# Patient Record
Sex: Female | Born: 1975 | Race: White | Hispanic: No | Marital: Single | Smoking: Former smoker
Health system: Southern US, Community
[De-identification: ages and names within clinical notes are randomized; demographics above are authoritative.]

## PROBLEM LIST (undated history)

## (undated) DIAGNOSIS — F1920 Other psychoactive substance dependence, uncomplicated: Secondary | ICD-10-CM

## (undated) HISTORY — PX: GASTRIC BYPASS: SHX52

---

## 2015-12-22 DIAGNOSIS — F419 Anxiety disorder, unspecified: Secondary | ICD-10-CM | POA: Insufficient documentation

## 2015-12-22 DIAGNOSIS — F1210 Cannabis abuse, uncomplicated: Secondary | ICD-10-CM | POA: Insufficient documentation

## 2016-01-04 DIAGNOSIS — F1020 Alcohol dependence, uncomplicated: Secondary | ICD-10-CM | POA: Insufficient documentation

## 2016-01-13 DIAGNOSIS — F1994 Other psychoactive substance use, unspecified with psychoactive substance-induced mood disorder: Secondary | ICD-10-CM | POA: Insufficient documentation

## 2017-07-23 DIAGNOSIS — D2239 Melanocytic nevi of other parts of face: Secondary | ICD-10-CM | POA: Diagnosis not present

## 2017-08-14 DIAGNOSIS — Z23 Encounter for immunization: Secondary | ICD-10-CM | POA: Diagnosis not present

## 2017-12-20 DIAGNOSIS — H5213 Myopia, bilateral: Secondary | ICD-10-CM | POA: Diagnosis not present

## 2017-12-20 DIAGNOSIS — H52223 Regular astigmatism, bilateral: Secondary | ICD-10-CM | POA: Diagnosis not present

## 2017-12-28 ENCOUNTER — Encounter (HOSPITAL_COMMUNITY): Payer: Self-pay

## 2017-12-28 ENCOUNTER — Other Ambulatory Visit: Payer: Self-pay

## 2017-12-28 ENCOUNTER — Emergency Department (HOSPITAL_COMMUNITY)
Admission: EM | Admit: 2017-12-28 | Discharge: 2017-12-28 | Disposition: A | Discharge status: Home / Self Care | Payer: No Typology Code available for payment source | Attending: Emergency Medicine | Admitting: Emergency Medicine

## 2017-12-28 DIAGNOSIS — M542 Cervicalgia: Secondary | ICD-10-CM | POA: Diagnosis not present

## 2017-12-28 DIAGNOSIS — R51 Headache: Secondary | ICD-10-CM | POA: Insufficient documentation

## 2017-12-28 DIAGNOSIS — M549 Dorsalgia, unspecified: Secondary | ICD-10-CM | POA: Insufficient documentation

## 2017-12-28 MED ORDER — IBUPROFEN 800 MG PO TABS
800.0000 mg | ORAL_TABLET | Freq: Once | ORAL | Status: AC
Start: 2017-12-28 — End: 2017-12-28
  Administered 2017-12-28: 800 mg via ORAL
  Filled 2017-12-28: qty 1

## 2017-12-28 MED ORDER — CYCLOBENZAPRINE HCL 10 MG PO TABS: 10.0000 mg | ORAL_TABLET | Freq: Two times a day (BID) | ORAL | 0 refills | Status: AC | PRN

## 2017-12-28 MED ORDER — IBUPROFEN 800 MG PO TABS: 800.0000 mg | ORAL_TABLET | Freq: Three times a day (TID) | ORAL | 0 refills | Status: AC
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
## 2018-12-05 DIAGNOSIS — B977 Papillomavirus as the cause of diseases classified elsewhere: Secondary | ICD-10-CM | POA: Diagnosis not present

## 2018-12-05 DIAGNOSIS — D649 Anemia, unspecified: Secondary | ICD-10-CM | POA: Diagnosis not present

## 2018-12-05 DIAGNOSIS — N926 Irregular menstruation, unspecified: Secondary | ICD-10-CM | POA: Diagnosis not present

## 2018-12-05 DIAGNOSIS — Z01419 Encounter for gynecological examination (general) (routine) without abnormal findings: Secondary | ICD-10-CM | POA: Diagnosis not present

## 2018-12-05 DIAGNOSIS — Z23 Encounter for immunization: Secondary | ICD-10-CM | POA: Diagnosis not present

## 2018-12-05 DIAGNOSIS — Z114 Encounter for screening for human immunodeficiency virus [HIV]: Secondary | ICD-10-CM | POA: Diagnosis not present

## 2018-12-05 DIAGNOSIS — Z0000 Encounter for general adult medical examination without abnormal findings: Secondary | ICD-10-CM | POA: Diagnosis not present

## 2018-12-05 DIAGNOSIS — Z118 Encounter for screening for other infectious and parasitic diseases: Secondary | ICD-10-CM | POA: Diagnosis not present

## 2018-12-05 DIAGNOSIS — Z1329 Encounter for screening for other suspected endocrine disorder: Secondary | ICD-10-CM | POA: Diagnosis not present

## 2018-12-05 DIAGNOSIS — E538 Deficiency of other specified B group vitamins: Secondary | ICD-10-CM | POA: Diagnosis not present

## 2018-12-05 DIAGNOSIS — Z1151 Encounter for screening for human papillomavirus (HPV): Secondary | ICD-10-CM | POA: Diagnosis not present

## 2018-12-05 DIAGNOSIS — N760 Acute vaginitis: Secondary | ICD-10-CM | POA: Diagnosis not present

## 2018-12-05 DIAGNOSIS — Z139 Encounter for screening, unspecified: Secondary | ICD-10-CM | POA: Diagnosis not present

## 2018-12-05 DIAGNOSIS — Z113 Encounter for screening for infections with a predominantly sexual mode of transmission: Secondary | ICD-10-CM | POA: Diagnosis not present

## 2018-12-08 ENCOUNTER — Other Ambulatory Visit: Payer: Self-pay | Admitting: Family Medicine

## 2018-12-08 DIAGNOSIS — Z1231 Encounter for screening mammogram for malignant neoplasm of breast: Secondary | ICD-10-CM

## 2018-12-26 DIAGNOSIS — Z23 Encounter for immunization: Secondary | ICD-10-CM | POA: Diagnosis not present

## 2018-12-26 DIAGNOSIS — Z0000 Encounter for general adult medical examination without abnormal findings: Secondary | ICD-10-CM | POA: Diagnosis not present

## 2018-12-26 DIAGNOSIS — Z6832 Body mass index (BMI) 32.0-32.9, adult: Secondary | ICD-10-CM | POA: Diagnosis not present

## 2019-01-02 ENCOUNTER — Ambulatory Visit
Admission: RE | Admit: 2019-01-02 | Discharge: 2019-01-02 | Disposition: A | Discharge status: Home / Self Care | Payer: BLUE CROSS/BLUE SHIELD | Source: Ambulatory Visit | Attending: Family Medicine | Admitting: Family Medicine

## 2019-01-02 ENCOUNTER — Ambulatory Visit: Payer: Self-pay

## 2019-01-02 DIAGNOSIS — Z1231 Encounter for screening mammogram for malignant neoplasm of breast: Secondary | ICD-10-CM | POA: Diagnosis not present

## 2019-01-23 DIAGNOSIS — H5213 Myopia, bilateral: Secondary | ICD-10-CM | POA: Diagnosis not present

## 2019-01-23 DIAGNOSIS — H52223 Regular astigmatism, bilateral: Secondary | ICD-10-CM | POA: Diagnosis not present

## 2019-03-30 DIAGNOSIS — R899 Unspecified abnormal finding in specimens from other organs, systems and tissues: Secondary | ICD-10-CM | POA: Diagnosis not present

## 2019-03-30 DIAGNOSIS — R946 Abnormal results of thyroid function studies: Secondary | ICD-10-CM | POA: Diagnosis not present

## 2019-04-03 DIAGNOSIS — L659 Nonscarring hair loss, unspecified: Secondary | ICD-10-CM | POA: Diagnosis not present

## 2019-04-03 DIAGNOSIS — E039 Hypothyroidism, unspecified: Secondary | ICD-10-CM | POA: Diagnosis not present

## 2019-04-03 DIAGNOSIS — M7542 Impingement syndrome of left shoulder: Secondary | ICD-10-CM | POA: Diagnosis not present

## 2019-04-14 DIAGNOSIS — R197 Diarrhea, unspecified: Secondary | ICD-10-CM | POA: Diagnosis not present

## 2019-04-14 DIAGNOSIS — A084 Viral intestinal infection, unspecified: Secondary | ICD-10-CM | POA: Diagnosis not present

## 2019-04-14 DIAGNOSIS — A0472 Enterocolitis due to Clostridium difficile, not specified as recurrent: Secondary | ICD-10-CM | POA: Diagnosis not present

## 2019-04-15 DIAGNOSIS — A0472 Enterocolitis due to Clostridium difficile, not specified as recurrent: Secondary | ICD-10-CM | POA: Diagnosis not present

## 2019-04-15 DIAGNOSIS — R197 Diarrhea, unspecified: Secondary | ICD-10-CM | POA: Diagnosis not present

## 2019-04-15 DIAGNOSIS — A084 Viral intestinal infection, unspecified: Secondary | ICD-10-CM | POA: Diagnosis not present

## 2019-06-03 DIAGNOSIS — E039 Hypothyroidism, unspecified: Secondary | ICD-10-CM | POA: Diagnosis not present

## 2019-06-05 DIAGNOSIS — L659 Nonscarring hair loss, unspecified: Secondary | ICD-10-CM | POA: Diagnosis not present

## 2019-06-05 DIAGNOSIS — E039 Hypothyroidism, unspecified: Secondary | ICD-10-CM | POA: Diagnosis not present

## 2019-06-05 DIAGNOSIS — M7542 Impingement syndrome of left shoulder: Secondary | ICD-10-CM | POA: Diagnosis not present

## 2019-08-06 DIAGNOSIS — E039 Hypothyroidism, unspecified: Secondary | ICD-10-CM | POA: Diagnosis not present

## 2019-08-07 DIAGNOSIS — E039 Hypothyroidism, unspecified: Secondary | ICD-10-CM | POA: Diagnosis not present

## 2019-08-07 DIAGNOSIS — M7542 Impingement syndrome of left shoulder: Secondary | ICD-10-CM | POA: Diagnosis not present

## 2019-08-07 DIAGNOSIS — B373 Candidiasis of vulva and vagina: Secondary | ICD-10-CM | POA: Diagnosis not present

## 2020-01-21 DIAGNOSIS — D225 Melanocytic nevi of trunk: Secondary | ICD-10-CM | POA: Diagnosis not present

## 2020-01-21 DIAGNOSIS — L814 Other melanin hyperpigmentation: Secondary | ICD-10-CM | POA: Diagnosis not present

## 2020-02-24 ENCOUNTER — Ambulatory Visit: Payer: BLUE CROSS/BLUE SHIELD | Admitting: Physician Assistant

## 2020-02-24 DIAGNOSIS — Z0289 Encounter for other administrative examinations: Secondary | ICD-10-CM

## 2020-02-24 DIAGNOSIS — E039 Hypothyroidism, unspecified: Secondary | ICD-10-CM | POA: Insufficient documentation

## 2020-02-24 NOTE — Progress Notes (Deleted)
Sandra Kent is a 44 y.o. female here to establish care.  I acted as a Neurosurgeon for [employer], PA-C [employer], [place]  History of Present Illness:   No chief complaint on file.   Acute Concerns:   Chronic Issues: Hypothyroidism  Health Maintenance: Immunizations -- *** Colonoscopy -- *** Mammogram -- *** PAP -- *** Bone Density -- N/A PSA --N/A Diet -- *** Caffeine intake -- *** Sleep habits -- *** Exercise -- *** Weight --    Mood -- *** Alcohol -- *** Tobacco -- ***  No flowsheet data found.  No flowsheet data found.   Other providers/specialists: Patient Care Team: Jarold Motto, [place] as PCP - General (Physician Assistant)   No past medical history on file.   Social History   Socioeconomic History  . Marital status: Single    Spouse name: Not on file  . Number of children: Not on file  . Years of education: Not on file  . Highest education level: Not on file  Occupational History  . Not on file  Tobacco Use  . Smoking status: Not on file  Substance and Sexual Activity  . Alcohol use: Not on file  . Drug use: Not on file  . Sexual activity: Not on file  Other Topics Concern  . Not on file  Social History Narrative  . Not on file   Social Determinants of Health   Financial Resource Strain:   . Difficulty of Paying Living Expenses:   Food Insecurity:   . Worried About Programme researcher, broadcasting/film/video in the Last Year:   . Barista in the Last Year:   Transportation Needs:   . Freight forwarder (Medical):   Marland Kitchen Lack of Transportation (Non-Medical):   Physical Activity:   . Days of Exercise per Week:   . Minutes of Exercise per Session:   Stress:   . Feeling of Stress :   Social Connections:   . Frequency of Communication with Friends and Family:   . Frequency of Social Gatherings with Friends and Family:   . Attends Religious Services:   . Active Member of Clubs or Organizations:   . Attends Banker Meetings:    Marland Kitchen Marital Status:   Intimate Partner Violence:   . Fear of Current or Ex-Partner:   . Emotionally Abused:   Marland Kitchen Physically Abused:   . Sexually Abused:     Past Surgical History:  Procedure Laterality Date  . GASTRIC BYPASS      Family History  Problem Relation Age of Onset  . Breast cancer Neg Hx     Allergies  Allergen Reactions  . Effexor [Venlafaxine] Nausea And Vomiting     Current Medications:   Current Outpatient Medications:  .  cyclobenzaprine (FLEXERIL) 10 MG tablet, Take 1 tablet (10 mg total) by mouth 2 (two) times daily as needed for muscle spasms., Disp: 20 tablet, Rfl: 0 .  ibuprofen (ADVIL,MOTRIN) 800 MG tablet, Take 1 tablet (800 mg total) by mouth 3 (three) times daily., Disp: 21 tablet, Rfl: 0   Review of Systems:   ROS  Vitals:   There were no vitals filed for this visit.    There is no height or weight on file to calculate BMI.  Physical Exam:   Physical Exam  No results found for this or any previous visit.  Assessment and Plan:   There are no diagnoses linked to this encounter.  . Reviewed expectations re: course of current  medical issues. . Discussed self-management of symptoms. . Outlined signs and symptoms indicating need for more acute intervention. . Patient verbalized understanding and all questions were answered. . See orders for this visit as documented in the electronic medical record. . Patient received an After-Visit Summary.  ***  Inda Coke, PA-C

## 2020-07-11 DIAGNOSIS — L821 Other seborrheic keratosis: Secondary | ICD-10-CM | POA: Diagnosis not present

## 2020-07-14 DIAGNOSIS — E039 Hypothyroidism, unspecified: Secondary | ICD-10-CM | POA: Diagnosis not present

## 2022-01-01 ENCOUNTER — Other Ambulatory Visit: Payer: Self-pay | Admitting: Adult Medicine

## 2022-01-01 DIAGNOSIS — Z1231 Encounter for screening mammogram for malignant neoplasm of breast: Secondary | ICD-10-CM

## 2022-04-15 ENCOUNTER — Emergency Department (HOSPITAL_BASED_OUTPATIENT_CLINIC_OR_DEPARTMENT_OTHER): Payer: 59

## 2022-04-15 ENCOUNTER — Encounter (HOSPITAL_BASED_OUTPATIENT_CLINIC_OR_DEPARTMENT_OTHER): Payer: Self-pay

## 2022-04-15 ENCOUNTER — Emergency Department (HOSPITAL_BASED_OUTPATIENT_CLINIC_OR_DEPARTMENT_OTHER)
Admission: EM | Admit: 2022-04-15 | Discharge: 2022-04-15 | Disposition: A | Discharge status: Home / Self Care | Payer: 59 | Attending: Emergency Medicine | Admitting: Emergency Medicine

## 2022-04-15 ENCOUNTER — Other Ambulatory Visit: Payer: Self-pay

## 2022-04-15 DIAGNOSIS — S39012A Strain of muscle, fascia and tendon of lower back, initial encounter: Secondary | ICD-10-CM | POA: Diagnosis not present

## 2022-04-15 DIAGNOSIS — X58XXXA Exposure to other specified factors, initial encounter: Secondary | ICD-10-CM | POA: Insufficient documentation

## 2022-04-15 DIAGNOSIS — S3992XA Unspecified injury of lower back, initial encounter: Secondary | ICD-10-CM | POA: Diagnosis present

## 2022-04-15 HISTORY — DX: Other psychoactive substance dependence, uncomplicated: F19.20

## 2022-04-15 MED ORDER — LIDOCAINE 5 % EX PTCH: 1.0000 | MEDICATED_PATCH | CUTANEOUS | 0 refills | Status: DC

## 2022-04-15 MED ORDER — METHOCARBAMOL 500 MG PO TABS: 500.0000 mg | ORAL_TABLET | Freq: Two times a day (BID) | ORAL | 0 refills | Status: DC

## 2022-04-15 MED ORDER — LIDOCAINE 5 % EX PTCH
1.0000 | MEDICATED_PATCH | CUTANEOUS | Status: DC
  Administered 2022-04-15: 1 via TRANSDERMAL
  Filled 2022-04-15: qty 1

## 2022-04-15 MED ORDER — KETOROLAC TROMETHAMINE 30 MG/ML IJ SOLN
30.0000 mg | Freq: Once | INTRAMUSCULAR | Status: AC
  Administered 2022-04-15: 30 mg via INTRAMUSCULAR
  Filled 2022-04-15: qty 1

## 2022-04-15 NOTE — ED Triage Notes (Signed)
Pt presents POV with acute onset low back pain radiating into BLE. Pt reports while getting ready for church this morning she bent over to pick something up and felt her back pop and a "tearing sensation" fell to her knees and was unable to get up by herself. Pt denies any hx of the same.  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 Pt took 4 ibuprofen PTA

## 2022-04-15 NOTE — ED Provider Notes (Addendum)
[facility] EMERGENCY DEPT Provider Note   CSN: [id number] Arrival date & time: 04/15/22  1202     History  Chief Complaint  Patient presents with   Back Pain    Sandra Kent is a 46 y.o. female.  46 year old female presents with cute onset low back pain that occurred after she bent over.  Felt a pop in her back and had pain going down both legs.  Denies any prior history of back pain.  No fever or chills.  Took ibuprofen without relief.  Denies any saddle anesthesias.  No loss of bowel or bladder function      Home Medications Prior to Admission medications   Medication Sig Start Date End Date Taking? Authorizing Provider  cyanocobalamin 1000 MCG tablet Take by mouth.    [provider]  cyclobenzaprine (FLEXERIL) 10 MG tablet Take 1 tablet (10 mg total) by mouth 2 (two) times daily as needed for muscle spasms. 12/28/17   Fayrene Helper, PA-C  ibuprofen (ADVIL,MOTRIN) 800 MG tablet Take 1 tablet (800 mg total) by mouth 3 (three) times daily. 12/28/17   Fayrene Helper, PA-C  levothyroxine (SYNTHROID) 50 MCG tablet Take 50 mcg by mouth every other day. 01/28/20   [provider]  levothyroxine (SYNTHROID) 75 MCG tablet TAKE 1 TABLET (75 MCG) BY MOUTH EVERY OTHER DAY IN THE MORNING ON AN EMPTY STOMACH 01/28/20   [provider]      Allergies    Effexor [venlafaxine] and Other    Review of Systems   Review of Systems  All other systems reviewed and are negative.  Physical Exam Updated Vital Signs BP (!) 146/116 (BP Location: Left Arm)   Pulse 65   Temp 97.9 F (36.6 C)   Resp 19   SpO2 100%  Physical Exam Vitals and nursing note reviewed.  Constitutional:      General: She is not in acute distress.    Appearance: Normal appearance. She is well-developed. She is not toxic-appearing.  HENT:     Head: Normocephalic and atraumatic.  Eyes:     General: Lids are normal.     Conjunctiva/sclera: Conjunctivae normal.     Pupils:  Pupils are equal, round, and reactive to light.  Neck:     Thyroid: No thyroid mass.     Trachea: No tracheal deviation.  Cardiovascular:     Rate and Rhythm: Normal rate and regular rhythm.     Heart sounds: Normal heart sounds. No murmur heard.   No gallop.  Pulmonary:     Effort: Pulmonary effort is normal. No respiratory distress.     Breath sounds: Normal breath sounds. No stridor. No decreased breath sounds, wheezing, rhonchi or rales.  Abdominal:     General: There is no distension.     Palpations: Abdomen is soft.     Tenderness: There is no abdominal tenderness. There is no rebound.  Musculoskeletal:        General: No tenderness. Normal range of motion.     Cervical back: Normal range of motion and neck supple.       Back:  Skin:    General: Skin is warm and dry.     Findings: No abrasion or rash.  Neurological:     Mental Status: She is alert and oriented to person, place, and time. Mental status is at baseline.     GCS: GCS eye subscore is 4. GCS verbal subscore is 5. GCS motor subscore is 6.  Cranial Nerves: No cranial nerve deficit.     Sensory: No sensory deficit.     Motor: Motor function is intact.     Comments: Shortness 5 of 5 lower extremities  Psychiatric:        Attention and Perception: Attention normal.        Speech: Speech normal.        Behavior: Behavior normal.    ED Results / Procedures / Treatments   Labs (all labs ordered are listed, but only abnormal results are displayed) Labs Reviewed - No data to display  EKG None  Radiology No results found.  Procedures Procedures    Medications Ordered in ED Medications - No data to display  ED Course/ Medical Decision Making/ A&P                           Medical Decision Making Amount and/or Complexity of Data Reviewed Radiology: ordered.  Risk Prescription drug management.   Patient had a CT of lumbar spine which per radiology did not show any acute findings.  Patient  medicated for pain here and does feel better.  She is neurologically intact.  Plan will be to place patient anti-inflammatories muscle relaxants and Lidoderm patch and discharge home and follow-up with her doctor as needed        Final Clinical Impression(s) / ED Diagnoses Final diagnoses:  None    Rx / DC Orders ED Discharge Orders     None         Lorre Nick, MD 04/15/22 1800    Lorre Nick, MD 04/15/22 1800

## 2022-08-30 DIAGNOSIS — N390 Urinary tract infection, site not specified: Secondary | ICD-10-CM | POA: Diagnosis not present

## 2023-02-01 DIAGNOSIS — N3090 Cystitis, unspecified without hematuria: Secondary | ICD-10-CM | POA: Diagnosis not present

## 2023-05-13 ENCOUNTER — Other Ambulatory Visit: Payer: Self-pay

## 2023-05-13 ENCOUNTER — Emergency Department (HOSPITAL_COMMUNITY)
Admission: EM | Admit: 2023-05-13 | Discharge: 2023-05-13 | Disposition: A | Discharge status: Home / Self Care | Payer: Self-pay | Attending: Emergency Medicine | Admitting: Emergency Medicine

## 2023-05-13 DIAGNOSIS — S39012A Strain of muscle, fascia and tendon of lower back, initial encounter: Secondary | ICD-10-CM | POA: Insufficient documentation

## 2023-05-13 DIAGNOSIS — X501XXA Overexertion from prolonged static or awkward postures, initial encounter: Secondary | ICD-10-CM | POA: Insufficient documentation

## 2023-05-13 MED ORDER — METHOCARBAMOL 500 MG PO TABS
1000.0000 mg | ORAL_TABLET | Freq: Once | ORAL | Status: AC
  Administered 2023-05-13: 1000 mg via ORAL
  Filled 2023-05-13: qty 2

## 2023-05-13 MED ORDER — KETOROLAC TROMETHAMINE 10 MG PO TABS: 10.0000 mg | ORAL_TABLET | Freq: Four times a day (QID) | ORAL | 1 refills | Status: DC | PRN

## 2023-05-13 MED ORDER — LIDOCAINE 5 % EX PTCH: 1.0000 | MEDICATED_PATCH | CUTANEOUS | 1 refills | Status: AC

## 2023-05-13 MED ORDER — METHOCARBAMOL 500 MG PO TABS: 500.0000 mg | ORAL_TABLET | Freq: Two times a day (BID) | ORAL | 1 refills | Status: AC

## 2023-05-13 MED ORDER — LIDOCAINE 5 % EX PTCH
1.0000 | MEDICATED_PATCH | CUTANEOUS | Status: DC
  Administered 2023-05-13: 1 via TRANSDERMAL
  Filled 2023-05-13: qty 1

## 2023-05-13 MED ORDER — KETOROLAC TROMETHAMINE 10 MG PO TABS: 10.0000 mg | ORAL_TABLET | Freq: Four times a day (QID) | ORAL | 0 refills | Status: AC | PRN

## 2023-05-13 MED ORDER — KETOROLAC TROMETHAMINE 15 MG/ML IJ SOLN
15.0000 mg | Freq: Once | INTRAMUSCULAR | Status: AC
  Administered 2023-05-13: 15 mg via INTRAMUSCULAR
  Filled 2023-05-13: qty 1

## 2023-05-13 MED ORDER — METHOCARBAMOL 1000 MG/10ML IJ SOLN
1000.0000 mg | Freq: Once | INTRAMUSCULAR | Status: DC
  Filled 2023-05-13: qty 10

## 2023-05-13 NOTE — Discharge Instructions (Addendum)
Your back pain is most likely due to a muscular strain.  There is been a lot of research on back pain, unfortunately the only thing that seems to really help is Tylenol and NSAIDs.  Relative rest is also important to not lift greater than 10 pounds bending or twisting at the waist.  Please follow-up with your family physician.  The other thing that really seems to benefit patients is physical therapy which your doctor may send you for.  Please return to the emergency department for new numbness or weakness to your arms or legs. Difficulty with urinating or urinating or pooping on yourself.  Also if you cannot feel toilet paper when you wipe or get a fever.   Take the toradol as I have prescribed for you as needed for pain. Also take tylenol 1000mg (2 extra strength) four times a day.   Additionally, I have given you a prescription for Robaxin which is a muscle relaxer.  Take as prescribed as needed.  Do not drive or operate heavy machinery while taking this medication as it can be sedating.  I have also given you a lidocaine patch to wear as prescribed as well.  Additionally, given that this is your third flare of these episodes I have given you a referral to neurosurgery with a number to call to schedule appointment for follow-up.  Please follow-up with your primary care doctor as well.  Return if development of any new or worsening symptoms.

## 2023-05-13 NOTE — ED Triage Notes (Addendum)
Pt presents for back pain since wrenching back while filling a cooler with ice yesterday. Has re-injured several times over the past year while bending.  Some relief with chiropractor in past.  Has tried biofreeze, motrin, voltarin. In recovery and cannot take narcotics.  Denies urinary sx

## 2023-05-13 NOTE — ED Provider Notes (Signed)
[place] EMERGENCY DEPARTMENT AT [facility] Provider Note   CSN: [id number] Arrival date & time: 05/13/23  4098     History  Chief Complaint  Patient presents with   Back Pain    Sandra Kent is a 47 y.o. female.  Patient with noncontributory past medical history presents today with complaints of low back pain.  She states that same began yesterday when she bent over to fill a cooler with ice.  She states that this is her third flare of the same pain in the last year and pain is similar in nature each time.  She has been seeing a chiropractor for this pain with relief, however she states that in order to feel relief she has to go at least 2 times a week and that is very difficult for her to manage financially.  She denies fevers or chills, no loss of bowel or bladder function or saddle paresthesias.  She is able to walk but states it is painful.  She has been trying Biofreeze, Motrin, and Voltaren cream with minimal relief.  She is in recovery and does not want narcotics.  Denies any urinary symptoms, nausea, vomiting, or diarrhea.  Denies any history of malignancy or long-term steroid use.   The history is provided by the patient. No language interpreter was used.  Back Pain      Home Medications Prior to Admission medications   Medication Sig Start Date End Date Taking? Authorizing Provider  cyanocobalamin 1000 MCG tablet Take by mouth.    [provider]  cyclobenzaprine (FLEXERIL) 10 MG tablet Take 1 tablet (10 mg total) by mouth 2 (two) times daily as needed for muscle spasms. 12/28/17   Fayrene Helper, PA-C  ibuprofen (ADVIL,MOTRIN) 800 MG tablet Take 1 tablet (800 mg total) by mouth 3 (three) times daily. 12/28/17   Fayrene Helper, PA-C  levothyroxine (SYNTHROID) 50 MCG tablet Take 50 mcg by mouth every other day. 01/28/20   [provider]  levothyroxine (SYNTHROID) 75 MCG tablet TAKE 1 TABLET (75 MCG) BY MOUTH EVERY OTHER DAY IN THE MORNING ON  AN EMPTY STOMACH 01/28/20   [provider]  lidocaine (LIDODERM) 5 % Place 1 patch onto the skin daily. Remove & Discard patch within 12 hours or as directed by MD 04/15/22   Lorre Nick, MD  methocarbamol (ROBAXIN) 500 MG tablet Take 1 tablet (500 mg total) by mouth 2 (two) times daily. 04/15/22   Lorre Nick, MD      Allergies    Effexor [venlafaxine] and Other    Review of Systems   Review of Systems  Musculoskeletal:  Positive for back pain.  All other systems reviewed and are negative.   Physical Exam Updated Vital Signs BP (!) 160/79 (BP Location: Left Arm)   Pulse 87   Temp 98.2 F (36.8 C) (Oral)   Resp (!) 22   Wt 90.7 kg   SpO2 100%  Physical Exam Vitals and nursing note reviewed.  Constitutional:      General: She is not in acute distress.    Appearance: Normal appearance. She is normal weight. She is not ill-appearing, toxic-appearing or diaphoretic.  HENT:     Head: Normocephalic and atraumatic.  Cardiovascular:     Rate and Rhythm: Normal rate.  Pulmonary:     Effort: Pulmonary effort is normal. No respiratory distress.  Abdominal:     General: Abdomen is flat.     Palpations: Abdomen is soft.  Tenderness: There is no abdominal tenderness.  Musculoskeletal:        General: Normal range of motion.     Cervical back: Normal range of motion.     Comments: No tenderness to palpation of the cervical or thoracic spine.  TTP of the lumbar spine and bilateral paraspinous muscles.  No step-offs, lesions, deformity, or overlying skin changes.  5/5 strength and sensation intact in bilateral lower extremities.  DP and PT pulses intact and 2+.  Patient observed to be ambulatory with steady gait.  Skin:    General: Skin is warm and dry.  Neurological:     General: No focal deficit present.     Mental Status: She is alert.  Psychiatric:        Mood and Affect: Mood normal.        Behavior: Behavior normal.     ED Results / Procedures / Treatments    Labs (all labs ordered are listed, but only abnormal results are displayed) Labs Reviewed - No data to display  EKG None  Radiology No results found.  Procedures Procedures    Medications Ordered in ED Medications  ketorolac (TORADOL) 15 MG/ML injection 15 mg (has no administration in time range)  methocarbamol (ROBAXIN) injection 1,000 mg (has no administration in time range)    ED Course/ Medical Decision Making/ A&P                             Medical Decision Making Risk Prescription drug management.   This patient is a 47 y.o. female who presents to the ED for concern of back pain.   Differential diagnoses prior to evaluation: Fracture (acute/chronic), muscle strain, cauda equina, spinal stenosis, DDD, ligamentous injury, disk herniation, metastatic cancer, vertebral osteomyelitis, kidney stone, pyelonephritis, AAA   Past Medical History / Social History / Additional history: Chart reviewed. Pertinent results include: History of back pain similar nature to today, chart reviewed and patient was here in June 2023 and had a CT of her lumbar spine that was unremarkable for acute findings, but did show some shallow disc protrusions at L4-L5 and L5-S1.  Physical Exam: Physical exam performed. The pertinent findings include: Per above, TTP noted to the lumbar spine and surrounding paraspinous muscles without step-offs, lesions, deformity, or overlying skin changes.  Patient observed to be ambulatory with steady gait.  Medications / Treatment: Toradol and Robaxin   Disposition: After consideration of the diagnostic results and the patients response to treatment, I feel that emergency department workup does not suggest an emergent condition requiring admission or immediate intervention beyond what has been performed at this time. The plan is: Discharge with NSAIDs and muscle relaxers and lidocaine patch for pain.  Patient advised not to drive or operate heavy machinery  while taking Robaxin.  Will give neurosurgery referral for follow-up per patient's request given that this is her third flare of symptoms. Evaluation and diagnostic testing in the emergency department does not suggest an emergent condition requiring admission or immediate intervention beyond what has been performed at this time.  Plan for discharge with close PCP follow-up.  Patient is understanding and amenable with plan, educated on red flag symptoms that would prompt immediate return.  Patient discharged in stable condition.  Final Clinical Impression(s) / ED Diagnoses Final diagnoses:  Strain of lumbar region, initial encounter    Rx / DC Orders ED Discharge Orders          Ordered  ketorolac (TORADOL) 10 MG tablet  Every 6 hours PRN,   Status:  Discontinued        05/13/23 0800    lidocaine (LIDODERM) 5 %  Every 24 hours        05/13/23 0800    methocarbamol (ROBAXIN) 500 MG tablet  2 times daily        05/13/23 0800    ketorolac (TORADOL) 10 MG tablet  Every 6 hours PRN        05/13/23 0802          An After Visit Summary was printed and given to the patient.     Silva Bandy, PA-C 05/13/23 0810    Rexford Maus, DO 05/13/23 [phone]
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
